# Patient Record
Sex: Female | Born: 1990 | Race: White | Hispanic: No | Marital: Single | Smoking: Never smoker
Health system: Southern US, Community
[De-identification: ages and names within clinical notes are randomized; demographics above are authoritative.]

## PROBLEM LIST (undated history)

## (undated) DIAGNOSIS — F419 Anxiety disorder, unspecified: Secondary | ICD-10-CM

## (undated) DIAGNOSIS — R7989 Other specified abnormal findings of blood chemistry: Secondary | ICD-10-CM

## (undated) DIAGNOSIS — F32A Depression, unspecified: Secondary | ICD-10-CM

## (undated) DIAGNOSIS — K769 Liver disease, unspecified: Secondary | ICD-10-CM

## (undated) DIAGNOSIS — R945 Abnormal results of liver function studies: Secondary | ICD-10-CM

## (undated) HISTORY — DX: Liver disease, unspecified: K76.9

## (undated) HISTORY — DX: Other specified abnormal findings of blood chemistry: R79.89

## (undated) HISTORY — DX: Depression, unspecified: F32.A

## (undated) HISTORY — DX: Abnormal results of liver function studies: R94.5

## (undated) HISTORY — DX: Anxiety disorder, unspecified: F41.9

---

## 2012-06-13 ENCOUNTER — Encounter (HOSPITAL_COMMUNITY): Payer: Self-pay

## 2012-06-13 ENCOUNTER — Emergency Department (HOSPITAL_COMMUNITY): Payer: Self-pay

## 2012-06-13 ENCOUNTER — Emergency Department (HOSPITAL_COMMUNITY)
Admission: EM | Admit: 2012-06-13 | Discharge: 2012-06-13 | Disposition: A | Discharge status: Home / Self Care | Payer: Self-pay | Attending: Emergency Medicine | Admitting: Emergency Medicine

## 2012-06-13 DIAGNOSIS — R1013 Epigastric pain: Secondary | ICD-10-CM | POA: Insufficient documentation

## 2012-06-13 DIAGNOSIS — Z3202 Encounter for pregnancy test, result negative: Secondary | ICD-10-CM | POA: Insufficient documentation

## 2012-06-13 DIAGNOSIS — R112 Nausea with vomiting, unspecified: Secondary | ICD-10-CM | POA: Insufficient documentation

## 2012-06-13 DIAGNOSIS — R109 Unspecified abdominal pain: Secondary | ICD-10-CM

## 2012-06-13 LAB — COMPREHENSIVE METABOLIC PANEL
ALT: 49 U/L — ABNORMAL HIGH (ref 0–35)
AST: 59 U/L — ABNORMAL HIGH (ref 0–37)
Albumin: 3.8 g/dL (ref 3.5–5.2)
Alkaline Phosphatase: 54 U/L (ref 39–117)
BUN: 7 mg/dL (ref 6–23)
CO2: 24 mEq/L (ref 19–32)
Calcium: 9.2 mg/dL (ref 8.4–10.5)
Chloride: 105 mEq/L (ref 96–112)
Creatinine, Ser: 0.76 mg/dL (ref 0.50–1.10)
GFR calc Af Amer: >90 mL/min (ref >90)
GFR calc non Af Amer: >90 mL/min (ref >90)
Glucose, Bld: 106 mg/dL — ABNORMAL HIGH (ref 70–99)
Potassium: 3.9 mEq/L (ref 3.5–5.1)
Sodium: 141 mEq/L (ref 135–145)
Total Bilirubin: 0.3 mg/dL (ref 0.3–1.2)
Total Protein: 7.2 g/dL (ref 6.0–8.3)

## 2012-06-13 LAB — LIPASE, BLOOD: Lipase: 20 U/L (ref 11–59)

## 2012-06-13 LAB — CBC
HCT: 41.5 % (ref 36.0–46.0)
Hemoglobin: 14.4 g/dL (ref 12.0–15.0)
MCH: 30.3 pg (ref 26.0–34.0)
MCHC: 34.7 g/dL (ref 30.0–36.0)
MCV: 87.4 fL (ref 78.0–100.0)
Platelets: 221 10*3/uL (ref 150–400)
RBC: 4.75 MIL/uL (ref 3.87–5.11)
RDW: 13 % (ref 11.5–15.5)
WBC: 12.6 10*3/uL — ABNORMAL HIGH (ref 4.0–10.5)

## 2012-06-13 LAB — PREGNANCY, URINE: Preg Test, Ur: NEGATIVE

## 2012-06-13 MED ORDER — ONDANSETRON HCL 4 MG/2ML IJ SOLN
4.0000 mg | Freq: Once | INTRAMUSCULAR | Status: AC
  Administered 2012-06-13: 4 mg via INTRAVENOUS
  Filled 2012-06-13: qty 2

## 2012-06-13 MED ORDER — MORPHINE SULFATE 4 MG/ML IJ SOLN
4.0000 mg | Freq: Once | INTRAMUSCULAR | Status: AC
  Administered 2012-06-13: 4 mg via INTRAVENOUS
  Filled 2012-06-13: qty 1

## 2012-06-13 MED ORDER — SODIUM CHLORIDE 0.9 % IV BOLUS (SEPSIS)
1000.0000 mL | Freq: Once | INTRAVENOUS | Status: AC
  Administered 2012-06-13: 1000 mL via INTRAVENOUS

## 2012-06-13 MED ORDER — GI COCKTAIL ~~LOC~~
30.0000 mL | Freq: Once | ORAL | Status: AC
  Administered 2012-06-13: 30 mL via ORAL
  Filled 2012-06-13: qty 30

## 2012-06-13 MED ORDER — ONDANSETRON HCL 4 MG PO TABS: 4.0000 mg | ORAL_TABLET | Freq: Four times a day (QID) | ORAL | Status: DC

## 2012-06-13 MED ORDER — TRAMADOL HCL 50 MG PO TABS: 50.0000 mg | ORAL_TABLET | Freq: Four times a day (QID) | ORAL | Status: DC | PRN

## 2012-06-13 NOTE — ED Notes (Signed)
Pt tearfully standing at bedside, dry heaving, unable to lie down

## 2012-06-13 NOTE — ED Provider Notes (Signed)
History    22 year old female with abdominal pain. Onset early this morning. Pain is in the epigastric region and radiates to her back. Associated with nausea and vomiting. The pain began before the vomiting did. No blood in the emesis. Patient describes the pain as sharp. No fevers or chills. Mild shortness of breath. She reports that her pain is worse with deep inspiration. No history similar complaints. No significant past medical history. No urinary complaints. o unusual vaginal bleeding or discharge. Drinks socially.  CSN: [id number]  Arrival date & time 06/13/12  8295   First MD Initiated Contact with Patient 06/13/12 [phone]      Chief Complaint  Patient presents with  . Abdominal Pain    (Consider location/radiation/quality/duration/timing/severity/associated sxs/prior treatment) HPI  History reviewed. No pertinent past medical history.  History reviewed. No pertinent past surgical history.  No family history on file.  History  Substance Use Topics  . Smoking status: Never Smoker   . Smokeless tobacco: Not on file  . Alcohol Use: Yes    OB History   Grav Para Term Preterm Abortions TAB SAB Ect Mult Living                  Review of Systems  All systems reviewed and negative, other than as noted in HPI.   Allergies  Review of patient's allergies indicates no known allergies.  Home Medications  No current outpatient prescriptions on file.  BP 140/102  Pulse 125  Temp(Src) 98.2 F (36.8 C) (Oral)  Resp 25  SpO2 100%  LMP 06/13/2012  Physical Exam  Nursing note and vitals reviewed. Constitutional: She appears well-developed and well-nourished.  Uncomfortable appearing. Standing beside bed hunched over at waist.   HENT:  Head: Normocephalic and atraumatic.  Eyes: Conjunctivae are normal. Right eye exhibits no discharge. Left eye exhibits no discharge.  Neck: Neck supple.  Cardiovascular: Regular rhythm and normal heart sounds.  Exam reveals no gallop  and no friction rub.   No murmur heard. tachycardic with reg rhythm.   Pulmonary/Chest: Effort normal and breath sounds normal. No respiratory distress.  Abdominal: Soft. She exhibits no distension. There is tenderness. There is guarding.  Epigastric tenderness w/ voluntary guarding. No rebound.   Genitourinary:  No cva tenderness  Musculoskeletal: She exhibits no edema and no tenderness.  Neurological: She is alert.  Skin: Skin is warm and dry.  Psychiatric: She has a normal mood and affect. Her behavior is normal. Thought content normal.    ED Course  Procedures (including critical care time)  Labs Reviewed  CBC - Abnormal; Notable for the following:    WBC 12.6 (*)    All other components within normal limits  COMPREHENSIVE METABOLIC PANEL - Abnormal; Notable for the following:    Glucose, Bld 106 (*)    AST 59 (*)    ALT 49 (*)    All other components within normal limits  LIPASE, BLOOD  PREGNANCY, URINE   Dg Chest 2 View  06/13/2012  *RADIOLOGY REPORT*  Clinical Data: Epigastric pain radiating to the back.  Dyspnea. Short of breath.  CHEST - 2 VIEW  Comparison: None.  Findings:  Cardiopericardial silhouette within normal limits. Mediastinal contours normal. Trachea midline.  No airspace disease or effusion.  IMPRESSION:  No active cardiopulmonary disease.   Original Report Authenticated By: Andreas Newport, M.D.    US Abdomen Complete  06/13/2012  *RADIOLOGY REPORT*  Clinical Data:  Epigastric pain.  Right upper quadrant pain.  COMPLETE ABDOMINAL ULTRASOUND  Comparison:  None.  Findings:  Gallbladder:  Partially contracted.  No stones, sludge, pericholecystic fluid or wall thickening.  Common bile duct:  4 mm, normal.  Liver:  15 mm x 9 mm x 13 mm hyperechoic nonspecific lesion is present in the right hepatic lobe (image number 13).  There is a suggestion of mild intrahepatic biliary ductal dilation of the left hepatic lobe however the right hepatic bile ducts appear normal.   IVC:  Appears normal.  Pancreas:  No focal abnormality seen.  Spleen:  7.8 cm.  Normal echotexture.  Right Kidney:  10.3 cm. Normal echotexture.  Normal central sinus echo complex.  No calculi or hydronephrosis.  Left Kidney:  10.7 cm. Normal echotexture.  Normal central sinus echo complex.  No calculi or hydronephrosis.  Abdominal aorta:  No aneurysm identified.  IMPRESSION: 1.  Partially contracted gallbladder without cholelithiasis. 2.  Nonspecific 13 mm right hepatic lobe parenchymal liver lesion; in this age group, focal nodular hyperplasia or hemangioma are statistically most likely. 3.  Suggestion of left hepatic lobe biliary ductal dilation; the clinical significance is unclear in the setting of normal bilirubin.  Follow-up MRI/MRCP of the liver with and without contrast recommended. Non-emergent MRI should be deferred until patient has been discharged for the acute illness, and can optimally cooperate with positioning and breath-holding instructions.   Original Report Authenticated By: Andreas Newport, M.D.      1. Abdominal pain   2. Nausea and vomiting       MDM  22yF with epigastric pain with radiation to back. Suspect gastritis, PUD. Pt did drink etoh last night, but seems more uncomfortable than typical "hang over." Consider pancreatitis, biliary colic but I feel less likely. Aortic dissection, PE, ACS considered as well but I have a low suspicion at this time. Not likely esophageal perforation with onset of pain prior to vomiting. Pt tachycardic and hypertensive but also very uncomfortable appearing. Will obtain IV access. Pain meds, antiemetics and IVF. Basic labs. Will check EKG and CXR as well. Will continue to monitor and reassess.  11:23 AM Pt with improved symptoms. W/u reassuring. I feel safe for discharge at this time. 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 Also suggestion of mild L intrahepatic biliary ductal dilation. Unclear significance. CBD ok.  Minimal elevation in LFTs, but not in  pattern of cholestasis. Discussed with pt and understands the need for outpt FU.          Raeford Razor, MD 06/13/12 [phone]

## 2012-06-13 NOTE — ED Notes (Addendum)
Pt arrives appearing to be gagging. Pt complains of upper abdominal pain that radiates to her central back. Pt has vomited x1 at home and x1 here in room. No Diarrhea. Pt reports being intoxicated last night with liquor. Pt states she hurts when she breathes in. Lung sounds clear bilaterally.

## 2012-08-18 DIAGNOSIS — R9389 Abnormal findings on diagnostic imaging of other specified body structures: Secondary | ICD-10-CM | POA: Insufficient documentation

## 2013-02-04 ENCOUNTER — Encounter: Payer: Self-pay | Admitting: Internal Medicine

## 2013-03-12 ENCOUNTER — Ambulatory Visit: Payer: Self-pay | Admitting: Internal Medicine

## 2013-03-19 ENCOUNTER — Ambulatory Visit (INDEPENDENT_AMBULATORY_CARE_PROVIDER_SITE_OTHER): Payer: BC Managed Care – PPO | Admitting: Internal Medicine

## 2013-03-19 ENCOUNTER — Encounter: Payer: Self-pay | Admitting: Internal Medicine

## 2013-03-19 VITALS — BP 100/66 | HR 80 | Ht 62.0 in | Wt 148.8 lb

## 2013-03-19 DIAGNOSIS — K769 Liver disease, unspecified: Secondary | ICD-10-CM

## 2013-03-19 DIAGNOSIS — R748 Abnormal levels of other serum enzymes: Secondary | ICD-10-CM | POA: Insufficient documentation

## 2013-03-19 DIAGNOSIS — Z23 Encounter for immunization: Secondary | ICD-10-CM

## 2013-03-19 DIAGNOSIS — R740 Nonspecific elevation of levels of transaminase and lactic acid dehydrogenase [LDH]: Secondary | ICD-10-CM

## 2013-03-19 DIAGNOSIS — R932 Abnormal findings on diagnostic imaging of liver and biliary tract: Secondary | ICD-10-CM

## 2013-03-19 DIAGNOSIS — R7401 Elevation of levels of liver transaminase levels: Secondary | ICD-10-CM

## 2013-03-19 HISTORY — DX: Liver disease, unspecified: K76.9

## 2013-03-19 NOTE — Patient Instructions (Addendum)
Your physician has requested that you go to the basement for the following lab work before leaving today: LFT  Today you have been given a flu vaccine.  You have been scheduled for an MRI at [facility] on 03/26/13. Your appointment time is 8:00pm. Please arrive 15 minutes prior to your appointment time for registration purposes. Please have nothing to eat or drink 4 hours prior to your appointment. However, if you have any metal in your body, have a pacemaker or defibrillator, please be sure to let your ordering physician know. This test typically takes 45 minutes to 1 hour to complete. You may call # [phone] if need to change date/time.  I appreciate the opportunity to care for you.

## 2013-03-19 NOTE — Progress Notes (Signed)
Subjective:  Referred by: Johnny Bridge, NP, [facility]   Patient ID: Paige Evans, female    DOB: 1991-01-24, 23 y.o.   MRN: [id number]  HPI The patient is a very pleasant 23 year old single woman here because of a history of an abnormal liver and bile duct on ultrasound. The patient had acute upper abdominal pain, right upper quadrant epigastric in April of 2014 and presented to the emergency department. She had been celebrating her birthday and drinking alcohol. Transaminases were mildly elevated. There was a 13 mm indeterminate lesion in the right lobe of the liver, and a suggestion of some dilation of the intrahepatic bile ducts on the left. MRI followup was recommended. She has not had pain like that again. She does give a history of a infraumbilical periumbilical pain that occurs intermittently over the past several years and last for about a day. It does not seem to be associated with menses or eating or any other triggers. GI review of systems is otherwise negative.  No Known Allergies Outpatient Prescriptions Prior to Visit  Medication Sig Dispense Refill  . norgestrel-ethinyl estradiol (LO/OVRAL,CRYSELLE) 0.3-30 MG-MCG tablet Take 1 tablet by mouth daily.      . ondansetron (ZOFRAN) 4 MG tablet Take 1 tablet (4 mg total) by mouth every 6 (six) hours.  12 tablet  0  . traMADol (ULTRAM) 50 MG tablet Take 1 tablet (50 mg total) by mouth every 6 (six) hours as needed for pain.  15 tablet  0   No facility-administered medications prior to visit.   History reviewed. No pertinent past medical history. History reviewed. No pertinent past surgical history. History   Social History  . Marital Status: Single    Spouse Name: N/A    Number of Children: N/A  . Years of Education: N/A   Occupational History  . dental hygienist    Social History Main Topics  . Smoking status: Never Smoker   . Smokeless tobacco: Never Used  . Alcohol Use: Yes  . Drug Use: No  . Sexual  Activity: Yes    Birth Control/ Protection: Pill   Other Topics Concern  . 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 Breast cancer    . Diabetes         Review of Systems Positive for headaches all other review of systems negative or as per history of present illness    Objective:   Physical Exam General:  Well-developed, well-nourished and in no acute distress Eyes:  anicteric. ENT:   Mouth and posterior pharynx free of lesions.  Neck:   supple w/o thyromegaly or mass.  Lungs: Clear to auscultation bilaterally. Heart:  S1S2, no rubs, murmurs, gallops. Abdomen:  soft, non-tender, no hepatosplenomegaly, hernia, or mass and BS+.  Lymph:  no cervical or supraclavicular adenopathy. Extremities:   no edema Skin   no rash. , There is a small tattoo in the right lower back Neuro:  A&O x 3.  Psych:  appropriate mood and  Affect.   Data Reviewed:    Chemistry      Component Value Date/Time   NA 141 06/13/2012 0752   K 3.9 06/13/2012 0752   CL 105 06/13/2012 0752   CO2 24 06/13/2012 0752   BUN 7 06/13/2012 0752   CREATININE 0.76 06/13/2012 0752      Component Value Date/Time  CALCIUM 9.2 06/13/2012 0752   ALKPHOS 54 06/13/2012 0752   AST 59* 06/13/2012 0752   ALT 49* 06/13/2012 0752   BILITOT 0.3 06/13/2012 0752    Abdominal ultrasound 06/13/2012 IMPRESSION:  1. Partially contracted gallbladder without cholelithiasis.  2. Nonspecific 13 mm right hepatic lobe parenchymal liver lesion;  in this age group, focal nodular hyperplasia or hemangioma are  statistically most likely.  3. Suggestion of left hepatic lobe biliary ductal dilation; the  clinical significance is unclear in the setting of normal  bilirubin. Follow-up MRI/MRCP of the liver with and without  contrast recommended. Non-emergent MRI should be deferred until  patient has been discharged for the  acute illness, and can  optimally cooperate with positioning and breath-holding  instructions.       Assessment & Plan:   1. Abnormal liver ultrasound   2. Abnormal transaminases   3. Need for prophylactic vaccination and inoculation against influenza    I appreciate the opportunity to care for this patient. CC: Tessie Eke, NP

## 2013-03-19 NOTE — Assessment & Plan Note (Signed)
Will evaluate with MRI MRCP has radiology recommends. Further plans pending this. Hemangioma quite possible, could be artifact causing the suspicion of bile duct dilation.

## 2013-03-19 NOTE — Assessment & Plan Note (Signed)
Cause not clear could of been from alcohol she was drinking that night. Recheck today.

## 2013-03-26 ENCOUNTER — Other Ambulatory Visit: Payer: Self-pay | Admitting: Internal Medicine

## 2013-03-26 ENCOUNTER — Ambulatory Visit (HOSPITAL_COMMUNITY)
Admission: RE | Admit: 2013-03-26 | Discharge: 2013-03-26 | Disposition: A | Discharge status: Home / Self Care | Payer: BC Managed Care – PPO | Source: Ambulatory Visit | Attending: Internal Medicine | Admitting: Internal Medicine

## 2013-03-26 DIAGNOSIS — R7989 Other specified abnormal findings of blood chemistry: Secondary | ICD-10-CM | POA: Insufficient documentation

## 2013-03-26 DIAGNOSIS — R932 Abnormal findings on diagnostic imaging of liver and biliary tract: Secondary | ICD-10-CM

## 2013-03-26 DIAGNOSIS — R748 Abnormal levels of other serum enzymes: Secondary | ICD-10-CM

## 2013-03-26 DIAGNOSIS — K7689 Other specified diseases of liver: Secondary | ICD-10-CM | POA: Insufficient documentation

## 2013-03-26 MED ORDER — GADOBENATE DIMEGLUMINE 529 MG/ML IV SOLN
13.0000 mL | Freq: Once | INTRAVENOUS | Status: AC | PRN
  Administered 2013-03-26: 13 mL via INTRAVENOUS

## 2013-03-29 NOTE — Progress Notes (Signed)
Quick Note:  Let her know that buile ducts are ok There is a small (35mm) lesion in the liver - cannot tell if it is focal nodular hyperplasia or hepatic adenoma - these usually do not cause bad problems but if adenoma could have potential to become a problem and birth control can lead to growth. I do not recommend any changes right now but think she should schedule a non-urgent visit with me to discuss things and decide on whether or not to continue BCP and also plan f/u MRI in 1 year  ______

## 2013-05-10 ENCOUNTER — Telehealth: Payer: Self-pay | Admitting: Internal Medicine

## 2013-05-10 NOTE — Telephone Encounter (Signed)
Patient to have [name] placed on Friday.  Is this ok.  See hormone info below.       Release rate of levonorgestrel (LNG) is 14 mcg/day after 24 days and declines to 5 mcg/day after 3 years; Paige Evans must be removed or replaced after 3 years

## 2013-05-10 NOTE — Telephone Encounter (Signed)
She should not have this IUD placed  We need to let her GYN know that she has a liver lesion that might be a hepatic adenoma so generally try to avoid contraceptives and "liver tumor" is a contraindication to using this IUD  If there is an IUD that does not release progestins or estrogens (no hormones) then that would probably be ok  The patient needs a recall for an MR liver 1 year from the last one

## 2013-05-11 NOTE — Telephone Encounter (Signed)
Left message for patient to call back  

## 2013-05-12 NOTE — Telephone Encounter (Signed)
Patient reports she sees Dr. Rogue Bussing at [place].  She is notified not to have [place] placed.  I will fax this to [phone]

## 2013-05-12 NOTE — Telephone Encounter (Signed)
I have left a message for the patient that she should not have IUD placed on Friday.  She is asked to call back to discuss other options

## 2013-05-31 ENCOUNTER — Telehealth: Payer: Self-pay

## 2013-05-31 NOTE — Telephone Encounter (Signed)
Spoke with patient and told her we need her to come by and get her LFT's drawn , they were ordered at her January visit.  Hopefully she can do this before her 06/10/13 follow up visit with Dr. Carlean Purl.

## 2013-06-01 ENCOUNTER — Other Ambulatory Visit (INDEPENDENT_AMBULATORY_CARE_PROVIDER_SITE_OTHER): Payer: BC Managed Care – PPO

## 2013-06-01 DIAGNOSIS — R748 Abnormal levels of other serum enzymes: Secondary | ICD-10-CM

## 2013-06-01 DIAGNOSIS — R7401 Elevation of levels of liver transaminase levels: Secondary | ICD-10-CM

## 2013-06-01 DIAGNOSIS — R932 Abnormal findings on diagnostic imaging of liver and biliary tract: Secondary | ICD-10-CM

## 2013-06-01 DIAGNOSIS — R740 Nonspecific elevation of levels of transaminase and lactic acid dehydrogenase [LDH]: Secondary | ICD-10-CM

## 2013-06-01 LAB — HEPATIC FUNCTION PANEL
ALBUMIN: 3.8 g/dL (ref 3.5–5.2)
ALK PHOS: 47 U/L (ref 39–117)
ALT: 21 U/L (ref 0–35)
AST: 23 U/L (ref 0–37)
Bilirubin, Direct: 0.1 mg/dL (ref 0.0–0.3)
TOTAL PROTEIN: 7.1 g/dL (ref 6.0–8.3)
Total Bilirubin: 0.6 mg/dL (ref 0.3–1.2)

## 2013-06-10 ENCOUNTER — Ambulatory Visit (INDEPENDENT_AMBULATORY_CARE_PROVIDER_SITE_OTHER): Payer: BC Managed Care – PPO | Admitting: Internal Medicine

## 2013-06-10 ENCOUNTER — Encounter: Payer: Self-pay | Admitting: Internal Medicine

## 2013-06-10 VITALS — BP 118/74 | HR 76 | Ht 62.0 in | Wt 152.2 lb

## 2013-06-10 DIAGNOSIS — R932 Abnormal findings on diagnostic imaging of liver and biliary tract: Secondary | ICD-10-CM

## 2013-06-10 NOTE — Assessment & Plan Note (Addendum)
Recheck 01/2014 MR liver, to see if this lesion is growing. I explained that we cannot really tell whether it's [place] or adenoma. There is some risk of growth with birth control, birth control pills seem to help her and weighs beyond pure contraception and since this is only 11 mm we elected to continue her birth control pills at this time. She plans to call me in November, in order to try to reduce costs for her we will recheck the MRI in December of 2015.  CC: Tessie Eke, NP

## 2013-06-10 NOTE — Progress Notes (Signed)
Patient ID: Ismelda Weatherman, female   DOB: [birth year], 23 y.o.   MRN: [id number]      History of Present Illness:  The patient is here for followup of a right liver lobe lesion. Her mom is with her. She had some focal dilation of her bile ducts suspected on abdominal ultrasound, MRI and MRCP scan was performed on January 23 demonstrating the following.  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 Consider followup by  MRI in 12 months, with Eovist contrast, to confirm continued  stability.  No evidence of biliary dilatation or choledocholithiasis.   I had called her and explained that she could have either of the findings mentioned. She was asked to come and discuss things. She has no complaints at this time. She previously had some mildly elevated transaminases in the setting of alcohol consumption on her 21st birthday and recheck proves these have normalized.  She was considering getting IUD but it released hormone contraceptives with that as well so it was not recommended in the setting of liver tumors. She does take birth control pills which helped regulate her periods and make her have less pain and problems during menstruation.  Current Medications, Allergies, Past Medical History, Past Surgical History, Family History and Social History were reviewed in [employer] record.   Physical Exam: General: Pleasant, well developed , white female in no acute distress  Assessment and Recommendations: Abnormal MRI, liver - 11 mm lesion FNH vs adenoma Recheck 01/2014 MR liver, to see if this lesion is growing. I explained that we cannot really tell whether it's [place] or adenoma. There is some risk of growth with birth control, birth control pills seem to help her and weighs beyond pure contraception and since this is only 11 mm we elected to continue her birth control pills at this time. She  plans to call me in November, in order to try to reduce costs for her we will recheck the MRI in December of 2015.  CC: Tessie Eke, NP

## 2013-06-10 NOTE — Patient Instructions (Addendum)
Today you have been given information on Benign liver tumors to read over.   We will contact you in the late fall to set you up for a MRI of your liver to be done in December.  We are providing you with a copy of your last MRI for your information.  (mailed to pt)   I appreciate the opportunity to care for you.

## 2013-12-24 ENCOUNTER — Telehealth: Payer: Self-pay | Admitting: Internal Medicine

## 2013-12-24 DIAGNOSIS — D134 Benign neoplasm of liver: Secondary | ICD-10-CM

## 2013-12-24 NOTE — Telephone Encounter (Signed)
Trying to sort out best birth control. She is back on progestin ? eskyla safest?  Call Dr. Rogue Bussing  [phone]

## 2014-01-02 NOTE — Telephone Encounter (Signed)
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 I had taken a call from her GYN last week and am following-up Once we see the MR can decide upon next step re: type of birth control

## 2014-01-03 NOTE — Telephone Encounter (Signed)
Patient notified of appt date and time.  She wants to change to an evening appt.  She is provided the number to scheduling to reschedule at her convenience.

## 2014-01-03 NOTE — Telephone Encounter (Signed)
Patient is scheduled for 01/17/14 8:00 arrive at 7:45 for 8:00.  She is to be NPO for 4 hours prior Left message for patient to call back

## 2014-01-03 NOTE — Telephone Encounter (Signed)
Left message for patient to call back  

## 2014-01-04 ENCOUNTER — Telehealth: Payer: Self-pay | Admitting: Internal Medicine

## 2014-01-17 ENCOUNTER — Ambulatory Visit (HOSPITAL_COMMUNITY)
Admission: RE | Admit: 2014-01-17 | Discharge: 2014-01-17 | Disposition: A | Discharge status: Home / Self Care | Payer: BC Managed Care – PPO | Source: Ambulatory Visit | Attending: Internal Medicine | Admitting: Internal Medicine

## 2014-01-17 ENCOUNTER — Ambulatory Visit (HOSPITAL_COMMUNITY): Payer: BC Managed Care – PPO

## 2014-01-17 DIAGNOSIS — D134 Benign neoplasm of liver: Secondary | ICD-10-CM | POA: Diagnosis present

## 2014-01-17 MED ORDER — GADOXETATE DISODIUM 0.25 MMOL/ML IV SOLN: 10.0000 mL | Freq: Once | INTRAVENOUS | Status: AC | PRN

## 2014-01-18 NOTE — Progress Notes (Signed)
Quick Note:  Let her know that the radiologist does not think this is an adenoma but either focal nodular hyperplasia or hemangioma I do not think any BCP or IUD is a problem based upon this.  She should see me in office in 1 year - please make an REV recall.  I will communicate with her GYN   ______

## 2014-01-25 ENCOUNTER — Telehealth: Payer: Self-pay

## 2014-01-25 NOTE — Telephone Encounter (Signed)
Left message to call me back so I can get good dates and times to try and set up a MRI of her liver.

## 2014-01-25 NOTE — Telephone Encounter (Signed)
-----   Message from Acquanetta Cabanilla E [place], [place] sent at 06/10/2013  4:54 PM EDT ----- Set up MRI of her liver for December 2015, f/u on right lobe liver lesion.

## 2014-02-02 NOTE — Telephone Encounter (Signed)
Patient actually had repeat 01/17/14. See result note.

## 2014-02-10 ENCOUNTER — Encounter: Payer: Self-pay | Admitting: Internal Medicine

## 2014-02-10 NOTE — Telephone Encounter (Signed)
Error

## 2014-06-02 NOTE — Telephone Encounter (Signed)
A user error has taken place.

## 2014-10-13 ENCOUNTER — Telehealth: Payer: Self-pay | Admitting: Internal Medicine

## 2014-10-13 NOTE — Telephone Encounter (Signed)
Patient advised that Dr. Carlean Purl dis not order a repeat MRI of the abdomen/liver but a follow up office visit.  No MRI abdomen at this time.  She will call back to set up an end of the year appt.

## 2015-02-08 ENCOUNTER — Encounter: Payer: Self-pay | Admitting: Internal Medicine

## 2015-02-13 DIAGNOSIS — R519 Headache, unspecified: Secondary | ICD-10-CM | POA: Insufficient documentation

## 2015-02-13 DIAGNOSIS — F419 Anxiety disorder, unspecified: Secondary | ICD-10-CM | POA: Insufficient documentation

## 2015-02-13 DIAGNOSIS — F330 Major depressive disorder, recurrent, mild: Secondary | ICD-10-CM | POA: Insufficient documentation

## 2015-12-08 DIAGNOSIS — E669 Obesity, unspecified: Secondary | ICD-10-CM | POA: Insufficient documentation

## 2017-11-07 DIAGNOSIS — Z803 Family history of malignant neoplasm of breast: Secondary | ICD-10-CM | POA: Insufficient documentation

## 2019-03-05 DIAGNOSIS — R87619 Unspecified abnormal cytological findings in specimens from cervix uteri: Secondary | ICD-10-CM | POA: Insufficient documentation

## 2019-11-10 ENCOUNTER — Emergency Department
Admission: EM | Admit: 2019-11-10 | Discharge: 2019-11-10 | Disposition: A | Discharge status: Home / Self Care | Payer: Self-pay | Source: Home / Self Care

## 2019-11-10 ENCOUNTER — Emergency Department: Admit: 2019-11-10 | Discharge status: Absent without leave | Payer: Self-pay

## 2019-11-10 ENCOUNTER — Other Ambulatory Visit: Payer: Self-pay

## 2019-11-10 DIAGNOSIS — R0989 Other specified symptoms and signs involving the circulatory and respiratory systems: Secondary | ICD-10-CM

## 2019-11-10 DIAGNOSIS — Z9189 Other specified personal risk factors, not elsewhere classified: Secondary | ICD-10-CM

## 2019-11-10 HISTORY — DX: Anxiety disorder, unspecified: F41.9

## 2019-11-10 HISTORY — DX: Depression, unspecified: F32.A

## 2019-11-10 MED ORDER — IPRATROPIUM BROMIDE 0.03 % NA SOLN: 2.0000 | Freq: Two times a day (BID) | NASAL | 0 refills | Status: AC

## 2019-11-10 MED ORDER — AMOXICILLIN-POT CLAVULANATE 875-125 MG PO TABS: 1.0000 | ORAL_TABLET | Freq: Two times a day (BID) | ORAL | 0 refills | Status: AC

## 2019-11-10 NOTE — ED Provider Notes (Signed)
[facility]    CSN: [id number] Arrival date & time: 11/10/19  0914      History   Chief Complaint Chief Complaint  Patient presents with  . Facial Pain    sinus   . Sore Throat  . Cough  . Nasal Congestion  . Headache    HPI Paige Evans is a 29 y.o. female.   HPI Patient presents with sore throat, facial pressure, headache, nasal congestion over few days. Last night experienced low grade fever, 99.5 . No known COVID-19 exposure. Mild cough from nasal drainage. Past Medical History:  Diagnosis Date  . Anxiety   . Depression   . Elevated liver function tests   . Liver lesion 03/19/2013   99mm in central liver    Patient Active Problem List   Diagnosis Date Noted  . Abnormal Pap smear of cervix 03/05/2019  . Family history of breast cancer 11/07/2017  . Obesity, Class I, BMI 30-34.9 12/08/2015  . Anxiety 02/13/2015  . Chronic headache 02/13/2015  . Mild episode of recurrent major depressive disorder ([place]) 02/13/2015  . Abnormal MRI, liver - 11 mm lesion FNH vs adenoma 03/19/2013  . Abnormal ultrasound 08/18/2012    History reviewed. No pertinent surgical history.  OB History   No obstetric history on file.      Home Medications    Prior to Admission medications   Medication Sig Start Date End Date Taking? Authorizing Provider  sertraline (ZOLOFT) 100 MG tablet Take 1 tablet by mouth daily. 03/02/19  Yes [provider]  busPIRone (BUSPAR) 10 MG tablet Take 10 mg by mouth 2 (two) times daily. 05/30/19   [provider]  cetirizine (ZYRTEC) 10 MG tablet Take by mouth.    [provider]  norgestrel-ethinyl estradiol (LO/OVRAL,CRYSELLE) 0.3-30 MG-MCG tablet Take 1 tablet by mouth daily.    [provider]    Family History Family History  Problem Relation Age of Onset  . Breast cancer Other   . Diabetes Other   . Cancer Mother   . Bipolar disorder Father   . Depression Father     Social History Social  History   Tobacco Use  . Smoking status: Never Smoker  . Smokeless tobacco: Never Used  Substance Use Topics  . Alcohol use: Yes    Comment: occasionally  . Drug use: No     Allergies   Topiramate   Review of Systems Review of Systems Pertinent negatives listed in HPI  Physical Exam Triage Vital Signs ED Triage Vitals  Enc Vitals Group     BP 11/10/19 0937 119/76     Pulse Rate 11/10/19 0937 89     Resp 11/10/19 0937 17     Temp 11/10/19 0937 98.6 F (37 C)     Temp Source 11/10/19 0937 Oral     SpO2 11/10/19 0937 99 %     Weight 11/10/19 0938 185 lb (83.9 kg)     Height 11/10/19 0938 5\' 2"  (1.575 m)     Head Circumference --      Peak Flow --      Pain Score 11/10/19 0938 0     Pain Loc --      Pain Edu? --      Excl. in [place]? --    No data found.  Updated Vital Signs BP 119/76 (BP Location: Left Arm)   Pulse 89   Temp 98.6 F (37 C) (Oral)   Resp 17   Ht 5\' 2"  (  1.575 m)   Wt 185 lb (83.9 kg)   SpO2 99%   BMI 33.84 kg/m   Visual Acuity Right Eye Distance:   Left Eye Distance:   Bilateral Distance:    Right Eye Near:   Left Eye Near:    Bilateral Near:     Physical Exam General Appearance:    Alert, cooperative,ill-appearing, no distress  HENT:  Bilateral nares.  Tenderness with taking drainage, PND noted, congestion noted, oropharyngeal erythematous without exudate or edema.  Frontal and maxillary sinuses tender to palpation.  Eyes:    PERRL, conjunctiva/corneas clear, EOM's intact       Lungs:     Clear to auscultation bilaterally, respirations unlabored  Heart:    Regular rate and rhythm  Neurologic:   Awake, alert, oriented x 3. No apparent focal neurological           defect.          UC Treatments / Results  Labs (all labs ordered are listed, but only abnormal results are displayed) Labs Reviewed - No data to display  EKG   Radiology No results found.  Procedures Procedures (including critical care time)  Medications Ordered  in UC Medications - No data to display  Initial Impression / Assessment and Plan / UC Course  I have reviewed the triage vital signs and the nursing notes.  Pertinent labs & imaging results that were available during my care of the patient were reviewed by me and considered in my medical decision making (see chart for details).     Advised to trial symptomatic treatment first with Zyrtec and Atrovent nasal spray.  If no improvement in 3 days go ahead and start antibiotic.  COVID-19 test pending.  Work note provided.  Encouraged to isolate until results are known. Final Clinical Impressions(s) / UC Diagnoses   Final diagnoses:  At increased risk of exposure to COVID-19 virus  Sinus symptom     Discharge Instructions     Continue Zyrtec. Start Atrovent.  Pick-up Augmentin if no improvement by Friday.    ED Prescriptions    Medication Sig Dispense Auth. Provider   ipratropium (ATROVENT) 0.03 % nasal spray Place 2 sprays into both nostrils 2 (two) times daily. 30 mL Scot Jun, FNP   amoxicillin-clavulanate (AUGMENTIN) 875-125 MG tablet Take 1 tablet by mouth 2 (two) times daily for 10 days. 20 tablet Scot Jun, FNP     PDMP not reviewed this encounter.   Scot Jun, FNP 11/17/19 [phone]

## 2019-11-10 NOTE — ED Triage Notes (Signed)
Pt presents with sinus congestion/pain, nasal drainage, headache, cough, and sore throat. She has rcvd her 2nd dose Pfizzer on 11/03/2019 on day 1 post vaccine she fell ill however, felt better next day. She reports going to a concert on 3/08 and she could have been exposed there. OTC mucinex and nasal sprays. No other complaints at this time.

## 2019-11-10 NOTE — Discharge Instructions (Signed)
Continue Zyrtec. Start Atrovent.  Pick-up Augmentin if no improvement by Friday.

## 2019-11-12 ENCOUNTER — Telehealth: Payer: Self-pay | Admitting: Emergency Medicine

## 2019-11-12 NOTE — Telephone Encounter (Signed)
Call back regarding COVID test results- none available - explained results are now taking 3-4 days. Paige Evans had checked MY Chart & did not see results, she thought it would only take 48 hours.

## 2019-11-13 LAB — NOVEL CORONAVIRUS, NAA: SARS-CoV-2, NAA: NOT DETECTED

## 2019-11-13 LAB — SARS-COV-2, NAA 2 DAY TAT

## 2019-12-02 ENCOUNTER — Other Ambulatory Visit: Payer: Self-pay | Admitting: Family Medicine

## 2021-03-19 ENCOUNTER — Other Ambulatory Visit: Payer: Self-pay | Admitting: Obstetrics

## 2021-03-19 DIAGNOSIS — N644 Mastodynia: Secondary | ICD-10-CM

## 2021-04-13 ENCOUNTER — Ambulatory Visit
Admission: RE | Admit: 2021-04-13 | Discharge: 2021-04-13 | Disposition: A | Discharge status: Home / Self Care | Payer: No Typology Code available for payment source | Source: Ambulatory Visit | Attending: Obstetrics | Admitting: Obstetrics

## 2021-04-13 ENCOUNTER — Other Ambulatory Visit: Payer: Self-pay

## 2021-04-13 DIAGNOSIS — N644 Mastodynia: Secondary | ICD-10-CM

## 2021-04-27 ENCOUNTER — Other Ambulatory Visit: Payer: Self-pay

## 2023-05-31 IMAGING — MG DIGITAL DIAGNOSTIC BILAT W/ TOMO W/ CAD
6 of 10 series · 6 of 30 positions shown · non-contrast
Comparison: None.

CLINICAL DATA: 30-year-old presenting with intermittent focal pain
involving the UPPER OUTER QUADRANT of the RIGHT breast over the past
4-5 months. This is the patient's initial baseline mammogram.

Family history of breast cancer in her mother at age 45.
EXAM:
DIGITAL DIAGNOSTIC BILATERAL MAMMOGRAM WITH TOMOSYNTHESIS AND CAD;
ULTRASOUND RIGHT BREAST LIMITED
TECHNIQUE: Bilateral digital diagnostic mammography and breast tomosynthesis
was performed. The images were evaluated with computer-aided
detection.; Targeted ultrasound examination of the right breast was
performed.

[R MLO synth-2D]
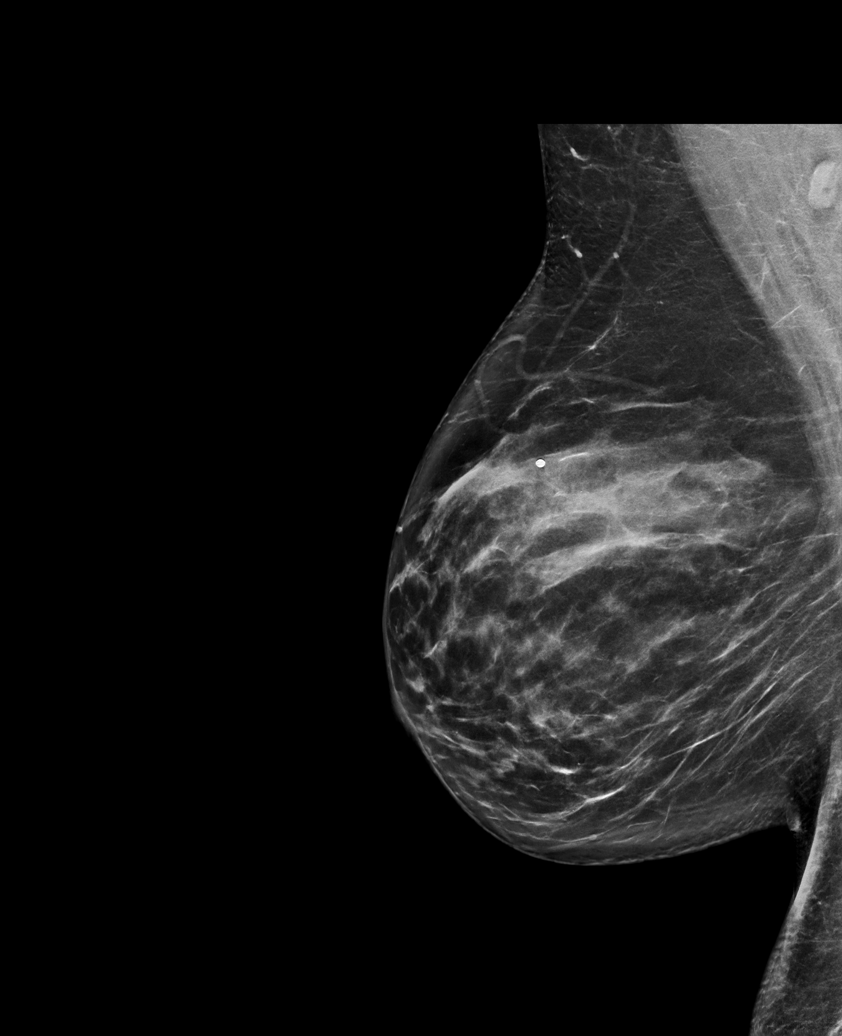

[R TAN synth-2D]
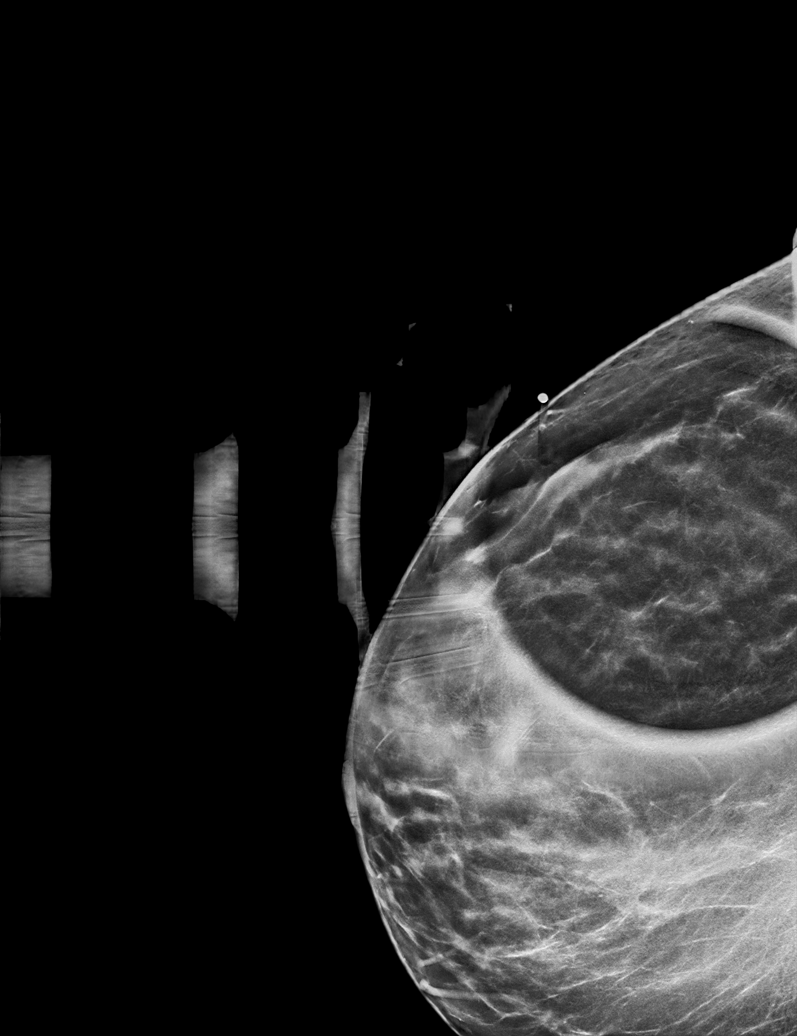

[L MLO synth-2D]
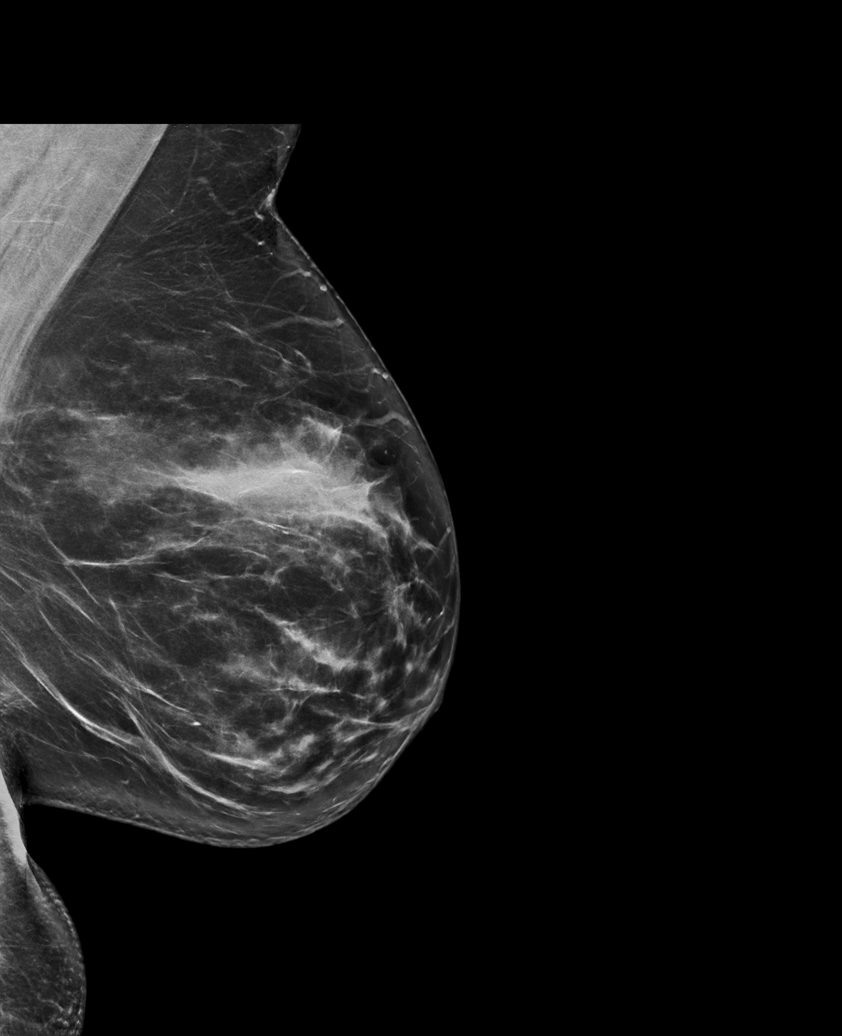

[R CC synth-2D]
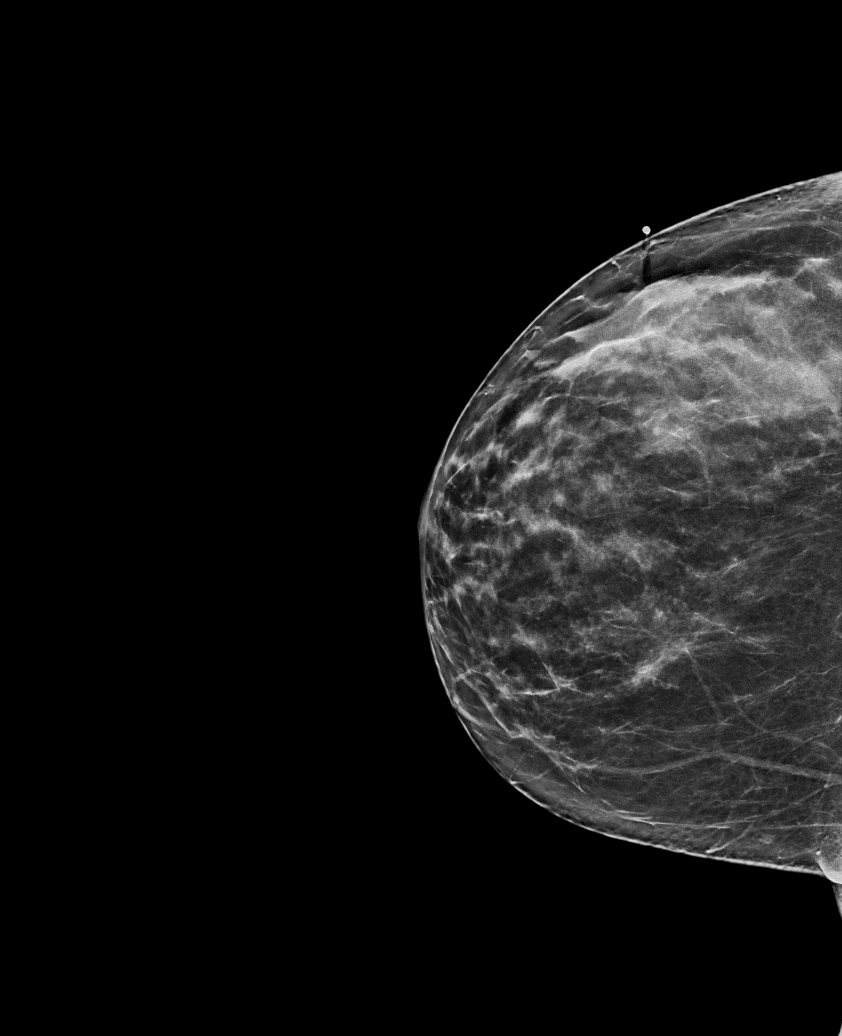

[L CC synth-2D]
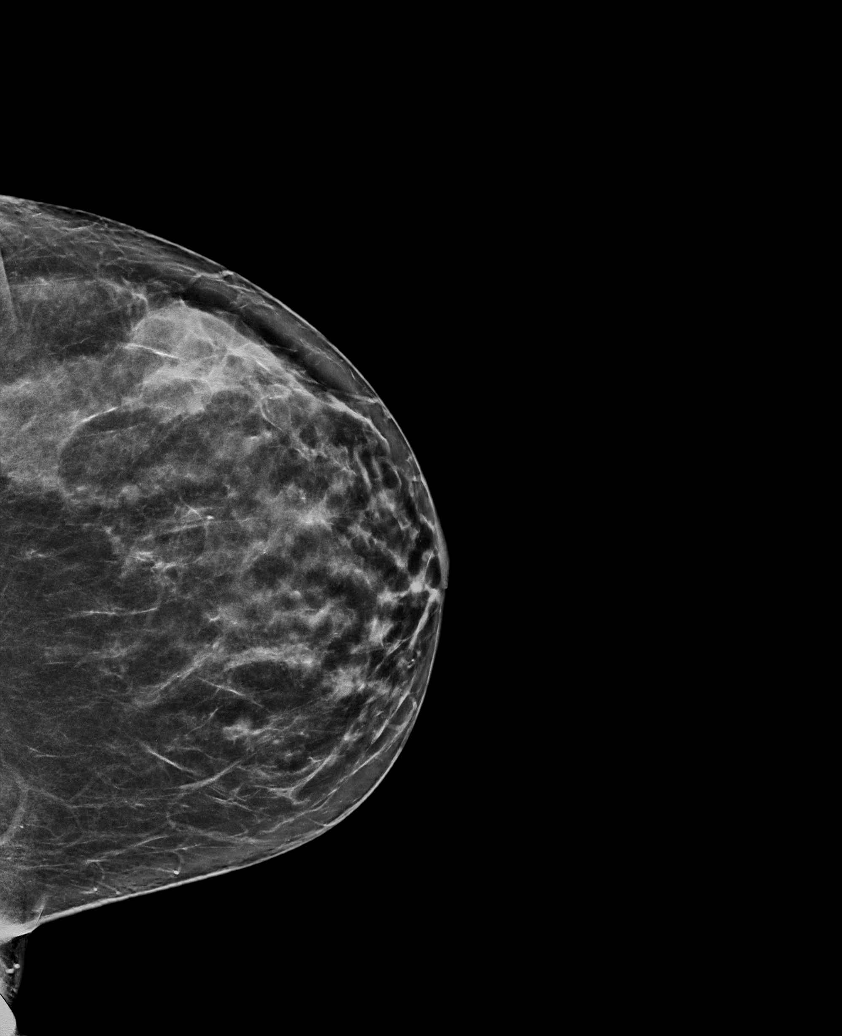

[L CC tomo · tomo slice 37/74.0]
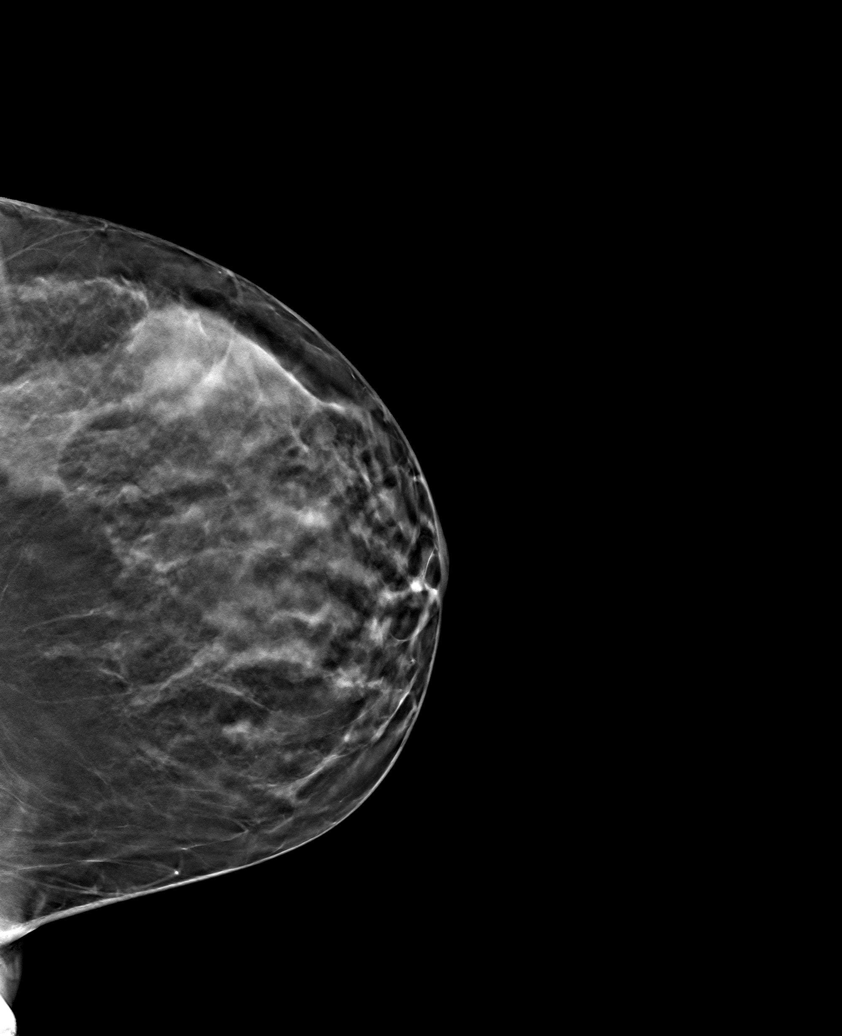

[6 of 30 positions shown; findings below may reference images not displayed]

ACR Breast Density Category c: The breast tissue is heterogeneously
dense, which may obscure small masses.
FINDINGS: Full field CC and MLO views of both breasts and a spot tangential
view of the site of focal pain in the RIGHT breast were obtained.

RIGHT: No findings suspicious for malignancy. No mammographic
abnormality in the UPPER OUTER QUADRANT at the site of focal pain;
normal dense fibroglandular tissue is present in this location.

Targeted ultrasound is performed at the site of focal pain,
demonstrating normal fibroglandular tissue at the 11 o'clock
position 5 cm from nipple. No cyst, solid mass or abnormal acoustic
shadowing is identified.

LEFT: No findings suspicious for malignancy.
IMPRESSION: 1. No mammographic or sonographic evidence of malignancy involving
the RIGHT breast.
2. No mammographic evidence of malignancy involving the LEFT breast.

RECOMMENDATION:
Annual screening mammography to begin at age 35 (10 years earlier
than the age of diagnosis of her mother).

I have discussed the findings and recommendations with the patient.
If applicable, a reminder letter will be sent to the patient
regarding the next appointment.

BI-RADS CATEGORY  1: Negative.
